# Patient Record
Sex: Female | Born: 1990 | Race: Black or African American | Hispanic: No | Marital: Single
Health system: Southern US, Community
[De-identification: ages and names within clinical notes are randomized; demographics above are authoritative.]

---

## 2020-07-07 ENCOUNTER — Emergency Department (HOSPITAL_COMMUNITY)
Admission: EM | Admit: 2020-07-07 | Discharge: 2020-07-08 | Disposition: A | Discharge status: Home / Self Care | Payer: Self-pay | Attending: Emergency Medicine | Admitting: Emergency Medicine

## 2020-07-07 ENCOUNTER — Other Ambulatory Visit: Payer: Self-pay

## 2020-07-07 ENCOUNTER — Encounter (HOSPITAL_COMMUNITY): Payer: Self-pay | Admitting: Emergency Medicine

## 2020-07-07 ENCOUNTER — Emergency Department (HOSPITAL_COMMUNITY): Payer: Self-pay

## 2020-07-07 DIAGNOSIS — R519 Headache, unspecified: Secondary | ICD-10-CM | POA: Insufficient documentation

## 2020-07-07 DIAGNOSIS — M25561 Pain in right knee: Secondary | ICD-10-CM | POA: Insufficient documentation

## 2020-07-07 DIAGNOSIS — Z5321 Procedure and treatment not carried out due to patient leaving prior to being seen by health care provider: Secondary | ICD-10-CM | POA: Insufficient documentation
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
## 2020-07-08 NOTE — ED Notes (Signed)
Pt called 3x no answer  
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
